# Patient Record
Sex: Male | Born: 1975 | Race: White | Hispanic: No | Marital: Single | Smoking: Current some day smoker
Health system: Southern US, Community
[De-identification: ages and names within clinical notes are randomized; demographics above are authoritative.]

---

## 2020-07-21 ENCOUNTER — Emergency Department
Admission: EM | Admit: 2020-07-21 | Discharge: 2020-07-21 | Disposition: A | Discharge status: Home / Self Care | Payer: Self-pay | Attending: Emergency Medicine | Admitting: Emergency Medicine

## 2020-07-21 ENCOUNTER — Encounter: Payer: Self-pay | Admitting: Emergency Medicine

## 2020-07-21 ENCOUNTER — Other Ambulatory Visit: Payer: Self-pay

## 2020-07-21 ENCOUNTER — Emergency Department: Payer: Self-pay

## 2020-07-21 DIAGNOSIS — F17200 Nicotine dependence, unspecified, uncomplicated: Secondary | ICD-10-CM | POA: Insufficient documentation

## 2020-07-21 DIAGNOSIS — Y990 Civilian activity done for income or pay: Secondary | ICD-10-CM | POA: Insufficient documentation

## 2020-07-21 DIAGNOSIS — Y92513 Shop (commercial) as the place of occurrence of the external cause: Secondary | ICD-10-CM | POA: Insufficient documentation

## 2020-07-21 DIAGNOSIS — S93402A Sprain of unspecified ligament of left ankle, initial encounter: Secondary | ICD-10-CM | POA: Insufficient documentation

## 2020-07-21 DIAGNOSIS — W208XXA Other cause of strike by thrown, projected or falling object, initial encounter: Secondary | ICD-10-CM | POA: Insufficient documentation

## 2020-07-21 MED ORDER — IBUPROFEN 600 MG PO TABS
600.0000 mg | ORAL_TABLET | Freq: Once | ORAL | Status: AC
  Administered 2020-07-21: 600 mg via ORAL
  Filled 2020-07-21: qty 1

## 2020-07-21 MED ORDER — NAPROXEN 500 MG PO TABS: 500.0000 mg | ORAL_TABLET | Freq: Once | ORAL | Status: DC

## 2020-07-21 MED ORDER — OXYCODONE-ACETAMINOPHEN 5-325 MG PO TABS
1.0000 | ORAL_TABLET | Freq: Once | ORAL | Status: AC
  Administered 2020-07-21: 1 via ORAL
  Filled 2020-07-21: qty 1

## 2020-07-21 NOTE — ED Notes (Signed)
See triage note  Presents with injury to left ankle/foot  States he jumped over something on Saturday    Twisted ankle  Ankle is swollen and tender to touch  Unable to bear full wt  Good pulses

## 2020-07-21 NOTE — Discharge Instructions (Addendum)
Wear splint for 3 to 5 days if needed.  Assist ambulation with crutches for the 3 to 5 days as needed.  Advise over-the-counter anti-inflammatory medication i.e. Motrin/extra strength Tylenol.

## 2020-07-21 NOTE — ED Triage Notes (Signed)
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 States he was standing on a lift, when he raised up a box that startled a rat underneath. He leapt up, and landed unevenly onto L ankle (about 4 in), heard crack. Iced it over the weekend, but today has limited mobility in toes. Good PMS, reports swelling is down.

## 2020-07-21 NOTE — ED Provider Notes (Signed)
[facility] Emergency Department Provider Note   ____________________________________________   Event Date/Time   First MD Initiated Contact with Patient 07/21/20 1350     (approximate)  I have reviewed the triage vital signs and the nursing notes.   HISTORY  Chief Complaint Ankle Injury    HPI Travis Guerrero is a 45 y.o. male patient presents with left ankle pain and edema.  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  Patient is able stand on the left and we raise of box he started a red causing him to jump and landed on even on the left ankle.  Patient said he heard a "crack".  Patient states pain continues even after conservative treatment of ice and limited movement.  Has noticed a decrease of the edema.  Pain increased with weightbearing.         History reviewed. No pertinent past medical history.  There are no problems to display for this patient.   History reviewed. No pertinent surgical history.  Prior to Admission medications   Not on File    Allergies Patient has no allergy information on record.  No family history on file.  Social History Social History   Tobacco Use  . Smoking status: Current Some Day Smoker  . Smokeless tobacco: Never Used  Substance Use Topics  . Alcohol use: Yes  . Drug use: Never    Review of Systems Constitutional: No fever/chills Eyes: No visual changes. ENT: No sore throat. Cardiovascular: Denies chest pain. Respiratory: Denies shortness of breath. Gastrointestinal: No abdominal pain.  No nausea, no vomiting.  No diarrhea.  No constipation. Genitourinary: Negative for dysuria. Musculoskeletal: Positive for left lateral ankle pain and swelling.   Skin: Negative for rash. Neurological: Negative for headaches, focal weakness or numbness. ____________________________________________   PHYSICAL EXAM:  VITAL SIGNS: ED Triage Vitals  Enc Vitals Group     BP 07/21/20 1340  (!) 151/106     Pulse Rate 07/21/20 1340 (!) 109     Resp 07/21/20 1340 18     Temp 07/21/20 1340 98.3 F (36.8 C)     Temp Source 07/21/20 1340 Oral     SpO2 07/21/20 1340 97 %     Weight 07/21/20 1341 255 lb (115.7 kg)     Height --      Head Circumference --      Peak Flow --      Pain Score 07/21/20 1341 8     Pain Loc --      Pain Edu? --      Excl. in GC? --    Constitutional: Alert and oriented. Well appearing and in no acute distress. Cardiovascular: Normal rate, regular rhythm. Grossly normal heart sounds.  Good peripheral circulation. Respiratory: Normal respiratory effort.  No retractions. Lungs CTAB. Musculoskeletal: No obvious deformity to the left ankle.  Patient is moderate guarding palpation the lateral malleolus..   Neurologic:  Normal speech and language. No gross focal neurologic deficits are appreciated. No gait instability. Skin:  Skin is warm, dry and intact. No rash noted.  No abrasion or ecchymosis. Psychiatric: Mood and affect are normal. Speech and behavior are normal.  ____________________________________________   LABS (all labs ordered are listed, but only abnormal results are displayed)  Labs Reviewed - No data to display ____________________________________________  EKG   ____________________________________________  RADIOLOGY I, Joni Reining, personally viewed and evaluated these images (plain radiographs) as part of my medical decision making, as well  as reviewing the written report by the radiologist.  ED MD interpretation: No acute findings  Official radiology report(s): No results found.  ____________________________________________   PROCEDURES  Procedure(s) performed (including Critical Care):  Procedures   ____________________________________________   INITIAL IMPRESSION / ASSESSMENT AND PLAN / ED COURSE  As part of my medical decision making, I reviewed the following data within the electronic MEDICAL RECORD NUMBER          Patient presents with left ankle pain secondary to a sprain.  Discussed no acute findings on x-ray.  Patient placed in ankle splint and given crutches assist with ambulation.  Advised ibuprofen or Tylenol as needed for pain.  Follow-up orthopedic if no improvement in 3 to 5 days.      ____________________________________________   FINAL CLINICAL IMPRESSION(S) / ED DIAGNOSES  Final diagnoses:  None     ED Discharge Orders    None      *Please note:  Travis Guerrero was evaluated in Emergency Department on 07/21/2020 for the symptoms described in the history of present illness. He was evaluated in the context of the global COVID-19 pandemic, which necessitated consideration that the patient might be at risk for infection with the SARS-CoV-2 virus that causes COVID-19. Institutional protocols and algorithms that pertain to the evaluation of patients at risk for COVID-19 are in a state of rapid change based on information released by regulatory bodies including the CDC and federal and state organizations. These policies and algorithms were followed during the patient's care in the ED.  Some ED evaluations and interventions may be delayed as a result of limited staffing during and the pandemic.*   Note:  This document was prepared using Dragon voice recognition software and may include unintentional dictation errors.    Joni Reining, PA-C 07/21/20 1512    [place], MD 07/21/20 [phone]

## 2021-08-10 IMAGING — DX DG FOOT COMPLETE 3+V*L*
3 series · 3 of 3 positions shown · non-contrast
Comparison: None

CLINICAL DATA: LEFT ankle/foot injury 2-3 days ago in the garage,
pain and swelling

EXAM:
LEFT FOOT - COMPLETE 3+ VIEW

[foot ap]
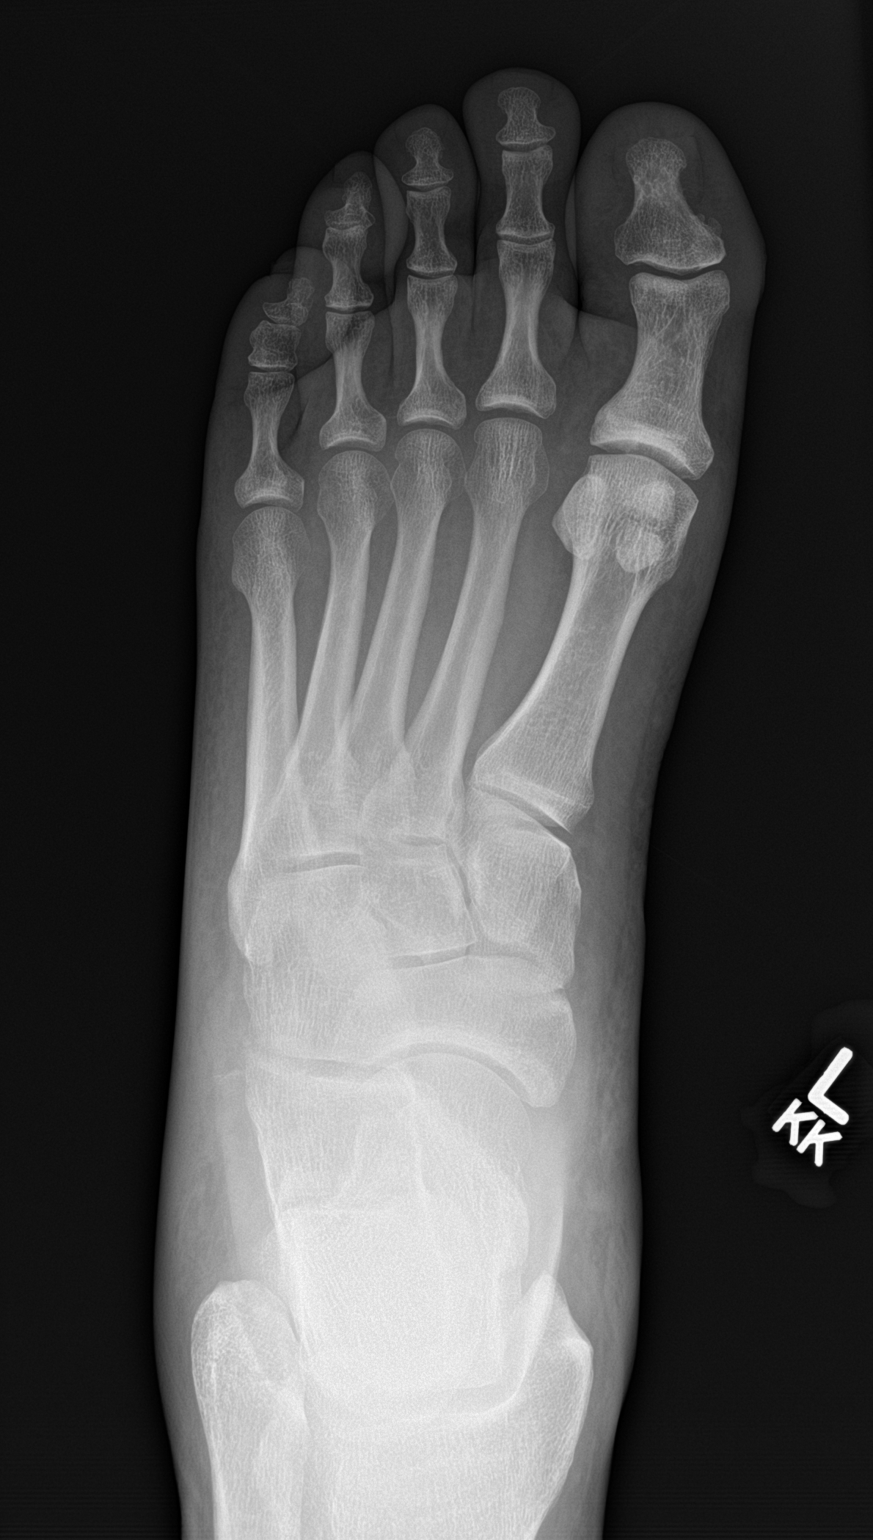

[foot obl]
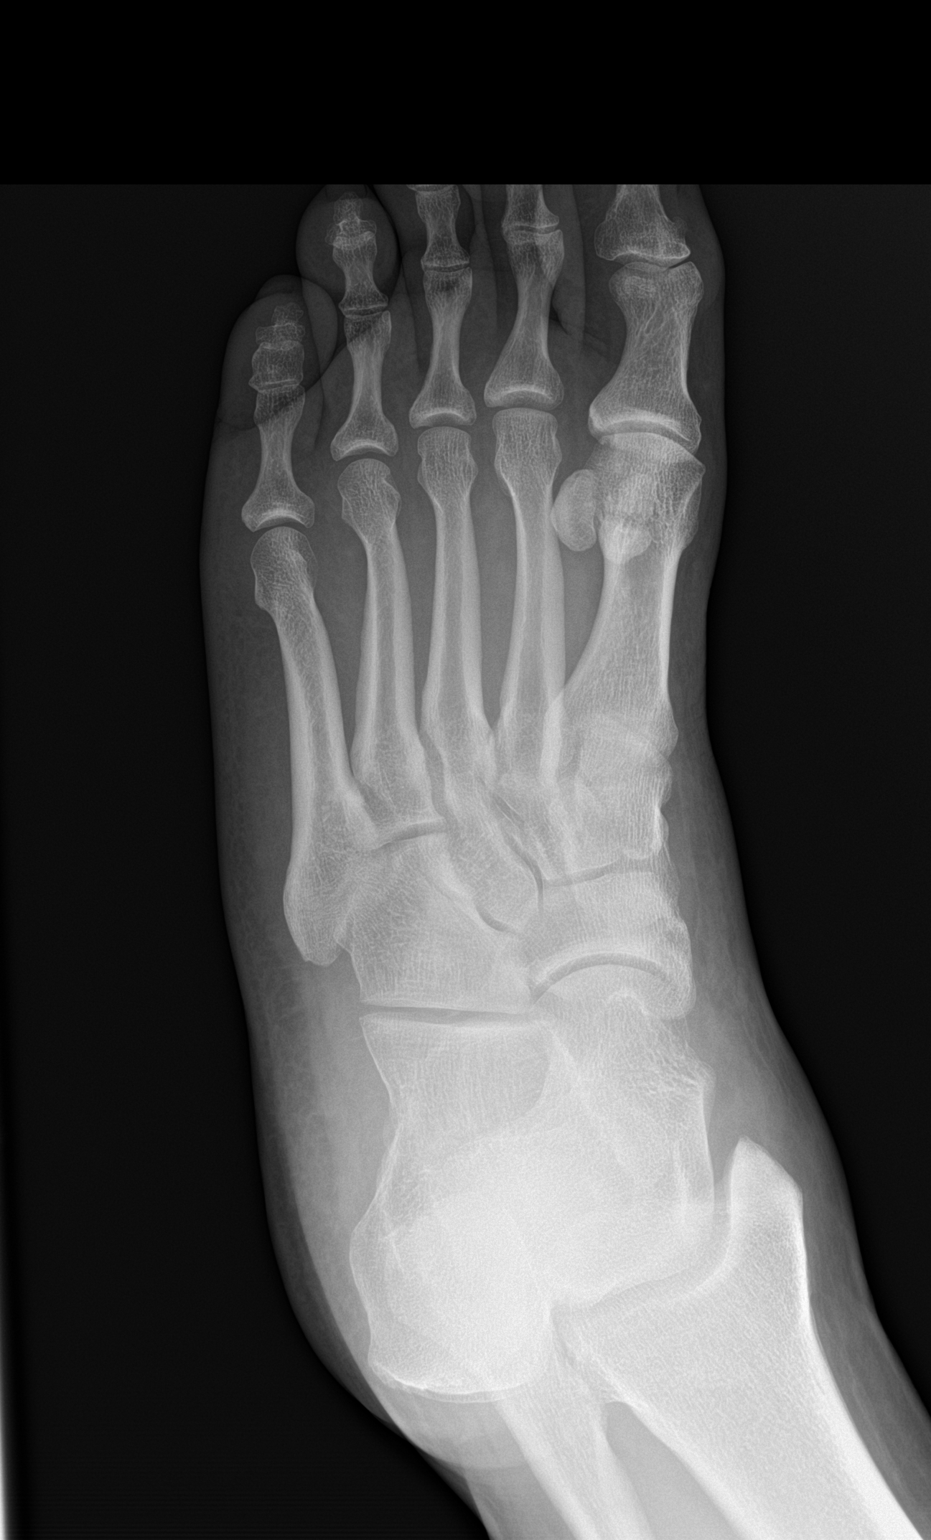

[foot lat]
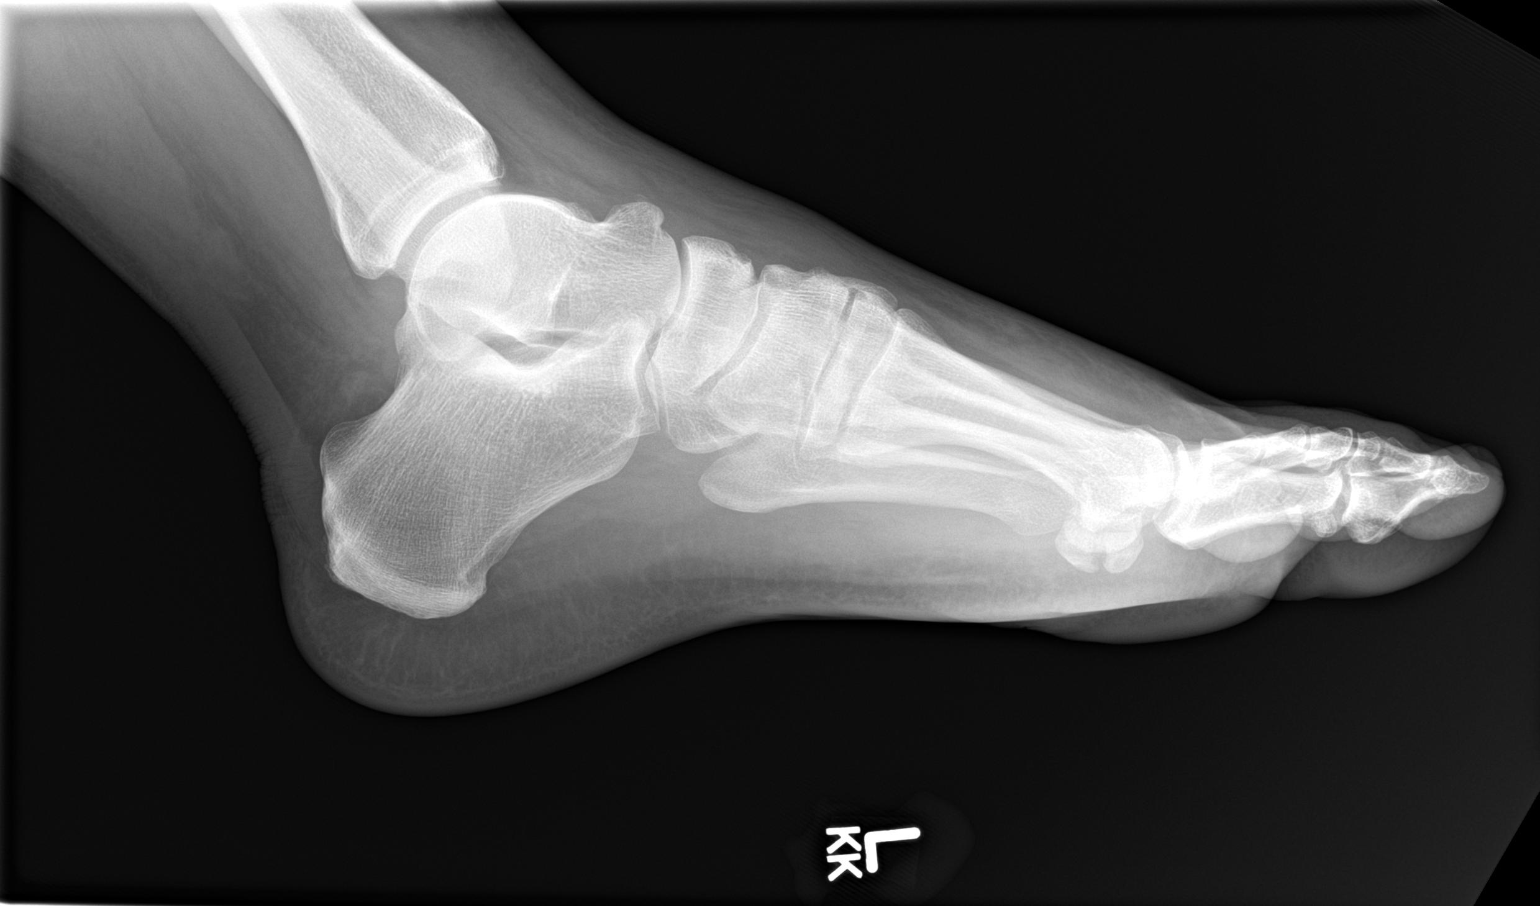

[3 of 3 positions shown; findings below may reference images not displayed]

FINDINGS: Osseous mineralization normal.

Joint spaces preserved.

No acute fracture, dislocation, or bone destruction.

Soft tissue swelling at LEFT foot and ankle.
IMPRESSION: No acute osseous abnormalities.
